# Patient Record
Sex: Male | Born: 1945 | Race: White | Hispanic: No | Marital: Married | Smoking: Never smoker
Health system: Southern US, Community
[De-identification: ages and names within clinical notes are randomized; demographics above are authoritative.]

## PROBLEM LIST (undated history)

## (undated) DIAGNOSIS — S0990XA Unspecified injury of head, initial encounter: Secondary | ICD-10-CM

## (undated) DIAGNOSIS — K219 Gastro-esophageal reflux disease without esophagitis: Secondary | ICD-10-CM

## (undated) DIAGNOSIS — E785 Hyperlipidemia, unspecified: Secondary | ICD-10-CM

## (undated) DIAGNOSIS — R9089 Other abnormal findings on diagnostic imaging of central nervous system: Secondary | ICD-10-CM

## (undated) HISTORY — PX: OTHER SURGICAL HISTORY: SHX169

## (undated) HISTORY — DX: Gastro-esophageal reflux disease without esophagitis: K21.9

## (undated) HISTORY — DX: Hyperlipidemia, unspecified: E78.5

## (undated) HISTORY — DX: Other abnormal findings on diagnostic imaging of central nervous system: R90.89

## (undated) HISTORY — DX: Unspecified injury of head, initial encounter: S09.90XA

---

## 2001-04-06 ENCOUNTER — Encounter: Payer: Self-pay | Admitting: *Deleted

## 2001-04-06 ENCOUNTER — Encounter: Payer: Self-pay | Admitting: General Surgery

## 2001-04-06 ENCOUNTER — Inpatient Hospital Stay (HOSPITAL_COMMUNITY): Admission: AC | Admit: 2001-04-06 | Discharge: 2001-04-15 | Payer: Self-pay

## 2001-04-07 ENCOUNTER — Encounter: Payer: Self-pay | Admitting: General Surgery

## 2001-04-08 ENCOUNTER — Encounter: Payer: Self-pay | Admitting: Neurosurgery

## 2001-04-08 ENCOUNTER — Encounter: Payer: Self-pay | Admitting: General Surgery

## 2001-04-09 ENCOUNTER — Encounter: Payer: Self-pay | Admitting: Neurosurgery

## 2001-04-10 ENCOUNTER — Encounter: Payer: Self-pay | Admitting: General Surgery

## 2001-04-12 ENCOUNTER — Encounter: Payer: Self-pay | Admitting: Neurosurgery

## 2001-04-12 ENCOUNTER — Encounter: Payer: Self-pay | Admitting: General Surgery

## 2001-04-29 ENCOUNTER — Encounter: Payer: Self-pay | Admitting: General Surgery

## 2001-04-29 ENCOUNTER — Ambulatory Visit (HOSPITAL_COMMUNITY): Admission: RE | Admit: 2001-04-29 | Discharge: 2001-04-29 | Payer: Self-pay | Admitting: General Surgery

## 2005-03-08 ENCOUNTER — Emergency Department (HOSPITAL_COMMUNITY): Admission: EM | Admit: 2005-03-08 | Discharge: 2005-03-08 | Payer: Self-pay | Admitting: Emergency Medicine

## 2005-08-11 ENCOUNTER — Ambulatory Visit: Payer: Self-pay | Admitting: Internal Medicine

## 2005-08-12 ENCOUNTER — Ambulatory Visit: Payer: Self-pay | Admitting: Cardiology

## 2005-08-24 ENCOUNTER — Ambulatory Visit: Payer: Self-pay | Admitting: Internal Medicine

## 2011-01-14 ENCOUNTER — Encounter: Payer: Self-pay | Admitting: Internal Medicine

## 2011-02-12 ENCOUNTER — Ambulatory Visit: Payer: BC Managed Care – PPO | Admitting: *Deleted

## 2011-02-12 VITALS — Ht 72.0 in | Wt 169.1 lb

## 2011-02-12 DIAGNOSIS — Z1211 Encounter for screening for malignant neoplasm of colon: Secondary | ICD-10-CM

## 2011-02-12 MED ORDER — NA SULFATE-K SULFATE-MG SULF 17.5-3.13-1.6 GM/177ML PO SOLN: 1.0000 | Freq: Once | ORAL | Status: DC

## 2011-02-26 ENCOUNTER — Other Ambulatory Visit: Payer: Self-pay | Admitting: Internal Medicine

## 2013-04-12 ENCOUNTER — Ambulatory Visit: Payer: Self-pay | Admitting: Physical Medicine and Rehabilitation

## 2014-05-14 ENCOUNTER — Inpatient Hospital Stay (HOSPITAL_COMMUNITY)
Admission: EM | Admit: 2014-05-14 | Discharge: 2014-05-18 | DRG: 097 | Disposition: A | Discharge status: Home / Self Care | Payer: Medicare Other | Attending: Neurology | Admitting: Neurology

## 2014-05-14 ENCOUNTER — Emergency Department (HOSPITAL_COMMUNITY): Payer: Medicare Other

## 2014-05-14 ENCOUNTER — Encounter (HOSPITAL_COMMUNITY): Payer: Self-pay | Admitting: *Deleted

## 2014-05-14 DIAGNOSIS — G9389 Other specified disorders of brain: Secondary | ICD-10-CM | POA: Diagnosis present

## 2014-05-14 DIAGNOSIS — G936 Cerebral edema: Secondary | ICD-10-CM

## 2014-05-14 DIAGNOSIS — K219 Gastro-esophageal reflux disease without esophagitis: Secondary | ICD-10-CM | POA: Diagnosis present

## 2014-05-14 DIAGNOSIS — R253 Fasciculation: Secondary | ICD-10-CM | POA: Diagnosis present

## 2014-05-14 DIAGNOSIS — R9401 Abnormal electroencephalogram [EEG]: Secondary | ICD-10-CM | POA: Diagnosis present

## 2014-05-14 DIAGNOSIS — Z886 Allergy status to analgesic agent status: Secondary | ICD-10-CM

## 2014-05-14 DIAGNOSIS — R51 Headache: Secondary | ICD-10-CM | POA: Diagnosis present

## 2014-05-14 DIAGNOSIS — Z882 Allergy status to sulfonamides status: Secondary | ICD-10-CM

## 2014-05-14 DIAGNOSIS — B004 Herpesviral encephalitis: Secondary | ICD-10-CM | POA: Diagnosis present

## 2014-05-14 DIAGNOSIS — E785 Hyperlipidemia, unspecified: Secondary | ICD-10-CM | POA: Diagnosis present

## 2014-05-14 DIAGNOSIS — G0490 Encephalitis and encephalomyelitis, unspecified: Secondary | ICD-10-CM

## 2014-05-14 DIAGNOSIS — R569 Unspecified convulsions: Secondary | ICD-10-CM

## 2014-05-14 LAB — COMPREHENSIVE METABOLIC PANEL
ALT: 14 U/L (ref 0–53)
AST: 17 U/L (ref 0–37)
Albumin: 4 g/dL (ref 3.5–5.2)
Alkaline Phosphatase: 65 U/L (ref 39–117)
Anion gap: 13 (ref 5–15)
BUN: 14 mg/dL (ref 6–23)
CO2: 26 mEq/L (ref 19–32)
Calcium: 9.3 mg/dL (ref 8.4–10.5)
Chloride: 101 mEq/L (ref 96–112)
Creatinine, Ser: 1.09 mg/dL (ref 0.50–1.35)
GFR calc Af Amer: 77 mL/min — ABNORMAL LOW (ref >90)
GFR calc non Af Amer: 67 mL/min — ABNORMAL LOW (ref >90)
Glucose, Bld: 128 mg/dL — ABNORMAL HIGH (ref 70–99)
Potassium: 4.2 mEq/L (ref 3.7–5.3)
Sodium: 140 mEq/L (ref 137–147)
Total Bilirubin: 0.3 mg/dL (ref 0.3–1.2)
Total Protein: 7.6 g/dL (ref 6.0–8.3)

## 2014-05-14 LAB — CBC
HCT: 40.6 % (ref 39.0–52.0)
Hemoglobin: 13.6 g/dL (ref 13.0–17.0)
MCH: 30 pg (ref 26.0–34.0)
MCHC: 33.5 g/dL (ref 30.0–36.0)
MCV: 89.4 fL (ref 78.0–100.0)
Platelets: 234 10*3/uL (ref 150–400)
RBC: 4.54 MIL/uL (ref 4.22–5.81)
RDW: 14 % (ref 11.5–15.5)
WBC: 6.8 10*3/uL (ref 4.0–10.5)

## 2014-05-14 LAB — I-STAT CHEM 8, ED
BUN: 15 mg/dL (ref 6–23)
Calcium, Ion: 1.15 mmol/L (ref 1.13–1.30)
Chloride: 103 mEq/L (ref 96–112)
Creatinine, Ser: 1.1 mg/dL (ref 0.50–1.35)
Glucose, Bld: 131 mg/dL — ABNORMAL HIGH (ref 70–99)
HCT: 43 % (ref 39.0–52.0)
Hemoglobin: 14.6 g/dL (ref 13.0–17.0)
Potassium: 3.9 mEq/L (ref 3.7–5.3)
Sodium: 139 mEq/L (ref 137–147)
TCO2: 25 mmol/L (ref 0–100)

## 2014-05-14 LAB — I-STAT TROPONIN, ED: Troponin i, poc: 0 ng/mL (ref 0.00–0.08)

## 2014-05-14 LAB — PROTIME-INR
INR: 1.02 (ref 0.00–1.49)
Prothrombin Time: 13.5 seconds (ref 11.6–15.2)

## 2014-05-14 LAB — DIFFERENTIAL
Basophils Absolute: 0.1 10*3/uL (ref 0.0–0.1)
Basophils Relative: 1 % (ref 0–1)
Eosinophils Absolute: 0.2 10*3/uL (ref 0.0–0.7)
Eosinophils Relative: 3 % (ref 0–5)
Lymphocytes Relative: 25 % (ref 12–46)
Lymphs Abs: 1.7 10*3/uL (ref 0.7–4.0)
Monocytes Absolute: 1 10*3/uL (ref 0.1–1.0)
Monocytes Relative: 15 % — ABNORMAL HIGH (ref 3–12)
Neutro Abs: 3.8 10*3/uL (ref 1.7–7.7)
Neutrophils Relative %: 56 % (ref 43–77)

## 2014-05-14 LAB — ETHANOL: Alcohol, Ethyl (B): <11 mg/dL (ref 0–11)

## 2014-05-14 LAB — APTT: aPTT: 39 seconds — ABNORMAL HIGH (ref 24–37)

## 2014-05-14 MED ORDER — DEXAMETHASONE SODIUM PHOSPHATE 4 MG/ML IJ SOLN
4.0000 mg | INTRAMUSCULAR | Status: DC
  Administered 2014-05-15 – 2014-05-18 (×20): 4 mg via INTRAVENOUS
  Filled 2014-05-14 (×21): qty 1

## 2014-05-14 MED ORDER — KETOROLAC TROMETHAMINE 15 MG/ML IJ SOLN
15.0000 mg | Freq: Four times a day (QID) | INTRAMUSCULAR | Status: DC
  Administered 2014-05-15 – 2014-05-18 (×5): 15 mg via INTRAVENOUS
  Filled 2014-05-14 (×8): qty 1

## 2014-05-14 MED ORDER — LEVETIRACETAM IN NACL 500 MG/100ML IV SOLN
500.0000 mg | Freq: Two times a day (BID) | INTRAVENOUS | Status: DC
  Administered 2014-05-15 – 2014-05-18 (×7): 500 mg via INTRAVENOUS
  Filled 2014-05-14 (×8): qty 100

## 2014-05-14 MED ORDER — PANTOPRAZOLE SODIUM 20 MG PO TBEC
20.0000 mg | DELAYED_RELEASE_TABLET | Freq: Every day | ORAL | Status: DC
  Administered 2014-05-15 – 2014-05-18 (×4): 20 mg via ORAL
  Filled 2014-05-14 (×4): qty 1

## 2014-05-14 MED ORDER — NICARDIPINE HCL IN NACL 20-0.86 MG/200ML-% IV SOLN
3.0000 mg/h | INTRAVENOUS | Status: DC
  Filled 2014-05-14: qty 200

## 2014-05-14 MED ORDER — DEXAMETHASONE SODIUM PHOSPHATE 10 MG/ML IJ SOLN
10.0000 mg | INTRAMUSCULAR | Status: AC
  Administered 2014-05-14: 10 mg via INTRAVENOUS
  Filled 2014-05-14: qty 1

## 2014-05-14 MED ORDER — ACETAMINOPHEN 325 MG PO TABS
650.0000 mg | ORAL_TABLET | Freq: Once | ORAL | Status: AC
  Administered 2014-05-14: 650 mg via ORAL
  Filled 2014-05-14: qty 2

## 2014-05-14 MED ORDER — HEPARIN SODIUM (PORCINE) 5000 UNIT/ML IJ SOLN
5000.0000 [IU] | Freq: Three times a day (TID) | INTRAMUSCULAR | Status: DC
  Administered 2014-05-14 – 2014-05-16 (×5): 5000 [IU] via SUBCUTANEOUS
  Filled 2014-05-14 (×5): qty 1

## 2014-05-14 MED ORDER — ONDANSETRON HCL 4 MG/2ML IJ SOLN
4.0000 mg | Freq: Once | INTRAMUSCULAR | Status: AC
  Administered 2014-05-14: 4 mg via INTRAVENOUS
  Filled 2014-05-14: qty 2

## 2014-05-14 MED ORDER — LEVETIRACETAM IN NACL 1000 MG/100ML IV SOLN
1000.0000 mg | Freq: Once | INTRAVENOUS | Status: AC
  Administered 2014-05-14: 1000 mg via INTRAVENOUS
  Filled 2014-05-14: qty 100

## 2014-05-14 MED ORDER — LORAZEPAM 2 MG/ML IJ SOLN
1.0000 mg | Freq: Once | INTRAMUSCULAR | Status: AC
  Administered 2014-05-14: 1 mg via INTRAVENOUS
  Filled 2014-05-14: qty 1

## 2014-05-14 MED ORDER — METOCLOPRAMIDE HCL 5 MG/ML IJ SOLN
10.0000 mg | Freq: Three times a day (TID) | INTRAMUSCULAR | Status: DC
  Administered 2014-05-14 – 2014-05-18 (×11): 10 mg via INTRAVENOUS
  Filled 2014-05-14 (×11): qty 2

## 2014-05-14 MED ORDER — ROSUVASTATIN CALCIUM 5 MG PO TABS
5.0000 mg | ORAL_TABLET | Freq: Every day | ORAL | Status: DC
  Administered 2014-05-15 – 2014-05-18 (×4): 5 mg via ORAL
  Filled 2014-05-14 (×4): qty 1

## 2014-05-14 MED ORDER — SODIUM CHLORIDE 0.9 % IV SOLN
75.0000 mL/h | INTRAVENOUS | Status: DC
  Administered 2014-05-14 – 2014-05-16 (×2): 75 mL/h via INTRAVENOUS
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
## 2014-05-14 NOTE — ED Notes (Signed)
Contacted [facility] to call Code Stroke °
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
## 2014-05-14 NOTE — ED Notes (Signed)
Pt stated vomiting

## 2014-05-15 ENCOUNTER — Inpatient Hospital Stay (HOSPITAL_COMMUNITY): Payer: Medicare Other

## 2014-05-15 ENCOUNTER — Encounter (HOSPITAL_COMMUNITY): Payer: Self-pay | Admitting: *Deleted

## 2014-05-15 DIAGNOSIS — R569 Unspecified convulsions: Secondary | ICD-10-CM

## 2014-05-15 LAB — URINALYSIS, ROUTINE W REFLEX MICROSCOPIC
Bilirubin Urine: NEGATIVE
Glucose, UA: NEGATIVE mg/dL
Hgb urine dipstick: NEGATIVE
Ketones, ur: NEGATIVE mg/dL
Leukocytes, UA: NEGATIVE
Nitrite: NEGATIVE
Protein, ur: NEGATIVE mg/dL
Specific Gravity, Urine: 1.02 (ref 1.005–1.030)
Urobilinogen, UA: 0.2 mg/dL (ref 0.0–1.0)
pH: 6 (ref 5.0–8.0)

## 2014-05-15 LAB — RAPID URINE DRUG SCREEN, HOSP PERFORMED
Amphetamines: NOT DETECTED
Barbiturates: NOT DETECTED
Benzodiazepines: NOT DETECTED
Cocaine: NOT DETECTED
Opiates: NOT DETECTED
Tetrahydrocannabinol: NOT DETECTED

## 2014-05-15 LAB — MRSA PCR SCREENING: MRSA by PCR: NEGATIVE

## 2014-05-15 MED ORDER — GADOBENATE DIMEGLUMINE 529 MG/ML IV SOLN
15.0000 mL | Freq: Once | INTRAVENOUS | Status: AC | PRN
  Administered 2014-05-15: 15 mL via INTRAVENOUS

## 2014-05-15 MED ORDER — DEXTROSE 5 % IV SOLN
10.0000 mg/kg | Freq: Three times a day (TID) | INTRAVENOUS | Status: DC
  Administered 2014-05-15 – 2014-05-18 (×9): 765 mg via INTRAVENOUS
  Filled 2014-05-15 (×13): qty 15.3

## 2014-05-15 NOTE — Procedures (Signed)
ELECTROENCEPHALOGRAM REPORT   Patient: Steven Taylor       Room #: 78M-02 EEG No. ID: [id number] Age: 68 y.o.        Sex: male Referring Physician: Dr Leroy Kennedyamilo Report Date:  05/15/2014        Interpreting Physician: Elspeth ChoSUMNER, Robi Mitter, Jill AlexandersJUSTIN  History: Steven ScarletDonald P Swanton is an 68 y.o. male  with new onset HA and very likely left face focal motor seizure in the context of significant vasogenic edema involving left temporal and parietal lobes with some mass effect over the ventricular system.  Medications:  Scheduled: . dexamethasone  4 mg Intravenous 6 times per day  . heparin  5,000 Units Subcutaneous 3 times per day  . ketorolac  15 mg Intravenous 4 times per day  . levETIRAcetam  500 mg Intravenous Q12H  . metoCLOPramide (REGLAN) injection  10 mg Intravenous 3 times per day  . pantoprazole  20 mg Oral Daily  . rosuvastatin  5 mg Oral Daily    Conditions of Recording:  This is a 16 channel EEG carried out with the patient in the drowsy and asleep state.  Description:  The waking background activity consists of a low voltage, symmetrical, fairly well organized, 9 to 10 Hz alpha activity that is intermixed with theta activity in the drowsy state, seen from the parieto-occipital and posterior temporal regions.  Low voltage fast activity, poorly organized, is seen anteriorly and is at times superimposed on more posterior regions.  There is noted intermittent focal slowing in the left temporal region with occasional sharp wave activity localized at T5.   The patient drowses with slowing to irregular, low voltage theta and beta activity. The patient goes in to a light sleep with symmetrical sleep spindles, vertex central sharp transients and irregular slow activity.   Hyperventilation and photic stimulation were not performed.   IMPRESSION: Abnormal EEG due to focal slowing in the left temporal region with intermittent sharp wave activity noted at T5. This may represent a potential seizure foci.     Elspeth Choeter Illa Enlow, DO [facility] [phone]  If 7pm- 7am, please page neurology on call as listed in AMION. 05/15/2014, 1:34 PM
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
## 2014-05-15 NOTE — Progress Notes (Signed)
UR completed.  Gabrielle Wakeland, RN BSN MHA CCM Trauma/Neuro ICU Case Manager [phone]  

## 2014-05-15 NOTE — Progress Notes (Signed)
ANTIBIOTIC CONSULT NOTE - INITIAL  Pharmacy Consult for Acyclovir Indication: R/O HSV encephalitits  Allergies  Allergen Reactions  . Sulfa Antibiotics Rash  . Codeine Nausea Only    Patient Measurements: Height: 6' (182.9 cm) Weight: 168 lb 10.4 oz (76.5 kg) IBW/kg (Calculated) : 77.6   Vital Signs: Temp: 97.8 F (36.6 C) (12/15 1600) Temp Source: Oral (12/15 1600) BP: 111/56 mmHg (12/15 1400) Pulse Rate: 71 (12/15 1400) Intake/Output from previous day: 12/14 0701 - 12/15 0700 In: 595 [I.V.:595] Out: 650 [Urine:350; Emesis/NG output:300] Intake/Output from this shift: Total I/O In: 475 [I.V.:375; IV Piggyback:100] Out: 200 [Urine:200]  Labs:  Recent Labs  05/14/14 2202 05/14/14 2210  WBC 6.8  --   HGB 13.6 14.6  PLT 234  --   CREATININE 1.09 1.10   Estimated Creatinine Clearance: 67.6 mL/min (by C-G formula based on Cr of 1.1). No results for input(s): VANCOTROUGH, VANCOPEAK, VANCORANDOM, GENTTROUGH, GENTPEAK, GENTRANDOM, TOBRATROUGH, TOBRAPEAK, TOBRARND, AMIKACINPEAK, AMIKACINTROU, AMIKACIN in the last 72 hours.   Microbiology: Recent Results (from the past 720 hour(s))  MRSA PCR Screening     Status: None   Collection Time: 05/15/14 12:26 AM  Result Value Ref Range Status   MRSA by PCR NEGATIVE NEGATIVE Final    Comment:        The GeneXpert MRSA Assay (FDA approved for NASAL specimens only), is one component of a comprehensive MRSA colonization surveillance program. It is not intended to diagnose MRSA infection nor to guide or monitor treatment for MRSA infections.     Medical History: Past Medical History  Diagnosis Date  . GERD (gastroesophageal reflux disease)   . Hyperlipidemia     Assessment: 68yom with acute onset MS changes and facial twitching possible new Sz activity.  WBC wnl, renal fx stable.      Plan:  Acyclovir 10mg /kg = 770mg  q8 hr  Leota SauersLisa Beckham Capistran Pharm.D. CPP, BCPS Clinical Pharmacist [phone] 05/15/2014 4:40  PM

## 2014-05-15 NOTE — Consult Note (Signed)
Reason for Consult:Brain edema Referring Physician: Neurology RANNY WIEBELHAUS is an 68 y.o. male.  HPI: 68 year old male in usual state of health until last evening when he had episode of confusion. He was having trouble driving, and was actually stopped and evaluated by the police twice. He was brought to [facility] ED where he underwent CT head followed by MRI that showed unusual edema pattern in the left hemisphere with some mild swelling of the temporal lobe, but no shift. There vwas no mass noted, and radiology was not sure what was going on but was suspicious for some sort of encephalitis. Neurosurgery was consulted for consideration of a brain biopsy.   Past Medical History  Diagnosis Date  . GERD (gastroesophageal reflux disease)   . Hyperlipidemia     Past Surgical History  Procedure Laterality Date  . Parosmia    . Disc surgurgy      1991    History reviewed. No pertinent family history.  Social History:  reports that he has never smoked. He has never used smokeless tobacco. He reports that he does not drink alcohol or use illicit drugs.  Allergies:  Allergies  Allergen Reactions  . Sulfa Antibiotics Rash  . Codeine Nausea Only    Medications: I have reviewed the patient's current medications.  Results for orders placed or performed during the hospital encounter of 05/14/14 (from the past 48 hour(s))  Ethanol     Status: None   Collection Time: 05/14/14 10:02 PM  Result Value Ref Range   Alcohol, Ethyl (B) <11 0 - 11 mg/dL    Comment:        LOWEST DETECTABLE LIMIT FOR SERUM ALCOHOL IS 11 mg/dL FOR MEDICAL PURPOSES ONLY   Protime-INR     Status: None   Collection Time: 05/14/14 10:02 PM  Result Value Ref Range   Prothrombin Time 13.5 11.6 - 15.2 seconds   INR 1.02 0.00 - 1.49  APTT     Status: Abnormal   Collection Time: 05/14/14 10:02 PM  Result Value Ref Range   aPTT 39 (H) 24 - 37 seconds    Comment:        IF BASELINE aPTT IS ELEVATED, SUGGEST PATIENT  RISK ASSESSMENT BE USED TO DETERMINE APPROPRIATE ANTICOAGULANT THERAPY.   CBC     Status: None   Collection Time: 05/14/14 10:02 PM  Result Value Ref Range   WBC 6.8 4.0 - 10.5 K/uL   RBC 4.54 4.22 - 5.81 MIL/uL   Hemoglobin 13.6 13.0 - 17.0 g/dL   HCT 40.6 39.0 - 52.0 %   MCV 89.4 78.0 - 100.0 fL   MCH 30.0 26.0 - 34.0 pg   MCHC 33.5 30.0 - 36.0 g/dL   RDW 14.0 11.5 - 15.5 %   Platelets 234 150 - 400 K/uL  Differential     Status: Abnormal   Collection Time: 05/14/14 10:02 PM  Result Value Ref Range   Neutrophils Relative % 56 43 - 77 %   Neutro Abs 3.8 1.7 - 7.7 K/uL   Lymphocytes Relative 25 12 - 46 %   Lymphs Abs 1.7 0.7 - 4.0 K/uL   Monocytes Relative 15 (H) 3 - 12 %   Monocytes Absolute 1.0 0.1 - 1.0 K/uL   Eosinophils Relative 3 0 - 5 %   Eosinophils Absolute 0.2 0.0 - 0.7 K/uL   Basophils Relative 1 0 - 1 %   Basophils Absolute 0.1 0.0 - 0.1 K/uL  Comprehensive metabolic panel  Status: Abnormal   Collection Time: 05/14/14 10:02 PM  Result Value Ref Range   Sodium 140 137 - 147 mEq/L   Potassium 4.2 3.7 - 5.3 mEq/L   Chloride 101 96 - 112 mEq/L   CO2 26 19 - 32 mEq/L   Glucose, Bld 128 (H) 70 - 99 mg/dL   BUN 14 6 - 23 mg/dL   Creatinine, Ser 1.09 0.50 - 1.35 mg/dL   Calcium 9.3 8.4 - 10.5 mg/dL   Total Protein 7.6 6.0 - 8.3 g/dL   Albumin 4.0 3.5 - 5.2 g/dL   AST 17 0 - 37 U/L   ALT 14 0 - 53 U/L   Alkaline Phosphatase 65 39 - 117 U/L   Total Bilirubin 0.3 0.3 - 1.2 mg/dL   GFR calc non Af Amer 67 (L) >90 mL/min   GFR calc Af Amer 77 (L) >90 mL/min    Comment: (NOTE) The eGFR has been calculated using the CKD EPI equation. This calculation has not been validated in all clinical situations. eGFR's persistently <90 mL/min signify possible Chronic Kidney Disease.    Anion gap 13 5 - 15  I-Stat Troponin, ED (not at [facility])     Status: None   Collection Time: 05/14/14 10:08 PM  Result Value Ref Range   Troponin i, poc 0.00 0.00 - 0.08 ng/mL   Comment 3             Comment: Due to the release kinetics of cTnI, a negative result within the first hours of the onset of symptoms does not rule out myocardial infarction with certainty. If myocardial infarction is still suspected, repeat the test at appropriate intervals.   I-Stat Chem 8, ED     Status: Abnormal   Collection Time: 05/14/14 10:10 PM  Result Value Ref Range   Sodium 139 137 - 147 mEq/L   Potassium 3.9 3.7 - 5.3 mEq/L   Chloride 103 96 - 112 mEq/L   BUN 15 6 - 23 mg/dL   Creatinine, Ser 1.10 0.50 - 1.35 mg/dL   Glucose, Bld 131 (H) 70 - 99 mg/dL   Calcium, Ion 1.15 1.13 - 1.30 mmol/L   TCO2 25 0 - 100 mmol/L   Hemoglobin 14.6 13.0 - 17.0 g/dL   HCT 43.0 39.0 - 52.0 %  MRSA PCR Screening     Status: None   Collection Time: 05/15/14 12:26 AM  Result Value Ref Range   MRSA by PCR NEGATIVE NEGATIVE    Comment:        The GeneXpert MRSA Assay (FDA approved for NASAL specimens only), is one component of a comprehensive MRSA colonization surveillance program. It is not intended to diagnose MRSA infection nor to guide or monitor treatment for MRSA infections.   Urine Drug Screen     Status: None   Collection Time: 05/15/14  1:18 AM  Result Value Ref Range   Opiates NONE DETECTED NONE DETECTED   Cocaine NONE DETECTED NONE DETECTED   Benzodiazepines NONE DETECTED NONE DETECTED   Amphetamines NONE DETECTED NONE DETECTED   Tetrahydrocannabinol NONE DETECTED NONE DETECTED   Barbiturates NONE DETECTED NONE DETECTED    Comment:        DRUG SCREEN FOR MEDICAL PURPOSES ONLY.  IF CONFIRMATION IS NEEDED FOR ANY PURPOSE, NOTIFY LAB WITHIN 5 DAYS.        LOWEST DETECTABLE LIMITS FOR URINE DRUG SCREEN Drug Class       Cutoff (ng/mL) Amphetamine      1000 Barbiturate  200 Benzodiazepine   149 Tricyclics       702 Opiates          300 Cocaine          300 THC              50   Urinalysis, Routine w reflex microscopic     Status: None   Collection Time: 05/15/14  1:18 AM   Result Value Ref Range   Color, Urine YELLOW YELLOW   APPearance CLEAR CLEAR   Specific Gravity, Urine 1.020 1.005 - 1.030   pH 6.0 5.0 - 8.0   Glucose, UA NEGATIVE NEGATIVE mg/dL   Hgb urine dipstick NEGATIVE NEGATIVE   Bilirubin Urine NEGATIVE NEGATIVE   Ketones, ur NEGATIVE NEGATIVE mg/dL   Protein, ur NEGATIVE NEGATIVE mg/dL   Urobilinogen, UA 0.2 0.0 - 1.0 mg/dL   Nitrite NEGATIVE NEGATIVE   Leukocytes, UA NEGATIVE NEGATIVE    Comment: MICROSCOPIC NOT DONE ON URINES WITH NEGATIVE PROTEIN, BLOOD, LEUKOCYTES, NITRITE, OR GLUCOSE <1000 mg/dL.    Ct Head Wo Contrast  05/14/2014   CLINICAL DATA:  Code stroke.  Left facial droop.  Frontal headache.  EXAM: CT HEAD WITHOUT CONTRAST  TECHNIQUE: Contiguous axial images were obtained from the base of the skull through the vertex without intravenous contrast.  COMPARISON:  04/12/2001 report  FINDINGS: Abnormal primarily white matter edema in the left temporal and parietal lobes, effacing the occipital horn and temporal horn of left lateral ventricle. Remote left frontal encephalomalacia, image 16 series 201. No intracranial hemorrhage is observed. I do not see an obvious mass although the vasogenic edema could be secondary to an underlying occult mass.  Basilar cisterns patent. No intracranial hemorrhage. Prior terminate resection and ethmoidectomy.  IMPRESSION: 1. Extensive white matter edema in the left temporal and parietal lobes, concerning for vasogenic edema raising the possibility of underlying mass or encephalitis. This would be an unusual pattern for acute CVA. When feasible, MRI should be considered for further workup. The edema is causing some effacement of the left ventricular system. No hydrocephalus. 2. 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 Critical Value/emergent results were called by telephone at the time of interpretation  on 05/14/2014 at 10:22 pm to Dr. Aram Beecham, who verbally acknowledged these results.   Electronically Signed   By: Sherryl Barters M.D.   On: 05/14/2014 22:25   Mr Jeri Cos OV Contrast  05/15/2014   CLINICAL DATA:  Initial evaluation for confusion, right-sided facial droop, slurred speech.  EXAM: MRI HEAD WITHOUT AND WITH CONTRAST  TECHNIQUE: Multiplanar, multiecho pulse sequences of the brain and surrounding structures were obtained without and with intravenous contrast.  CONTRAST:  35mL MULTIHANCE GADOBENATE DIMEGLUMINE 529 MG/ML IV SOLN  COMPARISON:  Prior CT performed on 05/14/2014.  FINDINGS: Mild diffuse prominence of the CSF containing spaces is compatible with generalized age-related cerebral atrophy. Mild patchy T2/FLAIR hyperintensity within the periventricular and deep white matter noted, nonspecific, but likely related to chronic small vessel ischemic changes.  Abnormal T2/FLAIR signal intensity seen involving the left temporal lobe, most compatible with cerebral edema. Changes predominantly involve the subcortical white matter, although there is some gyral edema and cortical involvement within this region. Abnormal FLAIR signal seen involving the splenium. There is additional abnormal T2/FLAIR signal intensity within the peripheral right temporal lobe as well, although not as pronounced as the left side. There is associated irregular  patchy areas of enhancement measuring 1.7 x 2.2 x 1.1 cm within the right temporal lobe (series 11, image 7). An additional smaller satellite nodule of enhancement slightly more posteriorly within the right temporal lobe measures 1.1 x 0.9 x 0.8 cm. No abnormal enhancement seen within the area of edema on the left temporal lobe. Additional smaller patchy areas of enhancement seen within the bilateral frontal lobes. No basal ganglia involvement. These findings are of uncertain etiology.  No midline shift. No hydrocephalus. There is no extra-axial fluid collection.  No  abnormal foci of restricted diffusion to suggest acute intracranial infarct. Normal intravascular flow voids seen within the brain.  Craniocervical junction is normal.  Basilar cisterns remain patent.  No acute abnormality seen about the orbits. Pituitary gland within normal limits.  Paranasal sinuses and mastoid air cells are clear.  IMPRESSION: 1. Multi focal areas of abnormal T2/FLAIR signal intensity with edema involving the bilateral temporal lobes, left greater than right, with additional involvement of the splenium and bilateral frontal lobes. There are areas of associated irregular postcontrast enhancement within the right temporal lobe. Findings are of uncertain etiology, with primary differential consideration including an acute encephalitis. Possible ADEM could also be considered. Involvement of the splenium raises the possibility of underlying diffusely infiltrative malignancy (gliomatosis cerebri). Lymphoma is felt to be unlikely given the lack of restricted diffusion.   Electronically Signed   By: Jeannine Boga M.D.   On: 05/15/2014 04:23    Review of systems not obtained due to patient factors. Blood pressure 131/59, pulse 72, temperature 97.8 F (36.6 C), temperature source Oral, resp. rate 18, height 6' (1.829 m), weight 77.792 kg (171 lb 8 oz), SpO2 98 %. Patient is awake, alert, conversant. he follows complex commands well. He is completely aware of what happened to him last night.  Assessment/Plan: CT and MRI is reviewed. 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 These patients however are usually quite ill, and Mr Wolz loooks quite good at this time. I have shown these films to many of my associates ( [place], [place], Vertell Limber, [place])  And we feel that there is a great deal that could be gained from an LP, and it might allow this gentleman to avoid having brain surgery. In addition, there is a great chance of no  diagnosis on biopsy secondary to sampling issues. There is apparently some concern about an LP due to his brain swelling, but none of the neurosurgeons felt that that was a significan risk, as his scans may show some edema, buit do not appear to show true increased ICP. We will be happy to follow, but do not feel that a brain biopsy should be the initial test in this gentleman in an attempt to make a diagnosis.  Faythe Ghee, MD 05/15/2014, 10:47 PM

## 2014-05-15 NOTE — Progress Notes (Signed)
EEG completed, results pending. 

## 2014-05-16 NOTE — Progress Notes (Signed)
Subjective: Patient remains stable.  No further seizures noted.  No new complaints.    Objective: Current vital signs: BP 120/50 mmHg  Pulse 63  Temp(Src) 98 F (36.7 C) (Oral)  Resp 16  Ht 6' (1.829 m)  Wt 77.792 kg (171 lb 8 oz)  BMI 23.25 kg/m2  SpO2 95% Vital signs in last 24 hours: Temp:  [97.6 F (36.4 C)-98 F (36.7 C)] 98 F (36.7 C) (12/16 0503) Pulse Rate:  [63-79] 63 (12/16 0503) Resp:  [12-18] 16 (12/16 0503) BP: (108-144)/(49-95) 120/50 mmHg (12/16 0503) SpO2:  [93 %-98 %] 95 % (12/16 0503) Weight:  [77.792 kg (171 lb 8 oz)] 77.792 kg (171 lb 8 oz) (12/15 2155)  Intake/Output from previous day: 12/15 0701 - 12/16 0700 In: 850 [I.V.:750; IV Piggyback:100] Out: 600 [Urine:600] Intake/Output this shift:   Nutritional status: Diet regular  Neurologic Exam: Mental Status: Alert, oriented x 4, thought content appropriate. Speech mildly dysarthric without evidence of aphasia. Able to follow 3 step commands without difficulty. Cranial Nerves: II: Discs flat bilaterally; Visual fields grossly normal, pupils equal, round, reactive to light and accommodation III,IV, VI: ptosis not present, extra-ocular motions intact bilaterally V,VII: smile asymmetric due to mild left facial droop, facial light touch sensation normal bilaterally VIII: hearing normal bilaterally IX,X: gag reflex present XI: bilateral shoulder shrug XII: midline tongue extension without atrophy or fasciculations  Motor: 5/5 throughout; no atrophy noted Sensory: Pinprick and light touch intact throughout, bilaterally Deep Tendon Reflexes:  2+ throughout Plantars: Right: downgoingLeft: downgoing Cerebellar: normal finger-to-nose, normal heel-to-shin testing bilaterally  Lab Results: Basic Metabolic Panel:  Recent Labs Lab 05/14/14 2202 05/14/14 2210  NA 140 139  K 4.2 3.9  CL 101 103  CO2 26  --   GLUCOSE 128* 131*  BUN 14 15  CREATININE 1.09 1.10   CALCIUM 9.3  --     Liver Function Tests:  Recent Labs Lab 05/14/14 2202  AST 17  ALT 14  ALKPHOS 65  BILITOT 0.3  PROT 7.6  ALBUMIN 4.0   No results for input(s): LIPASE, AMYLASE in the last 168 hours. No results for input(s): AMMONIA in the last 168 hours.  CBC:  Recent Labs Lab 05/14/14 2202 05/14/14 2210  WBC 6.8  --   NEUTROABS 3.8  --   HGB 13.6 14.6  HCT 40.6 43.0  MCV 89.4  --   PLT 234  --     Cardiac Enzymes: No results for input(s): CKTOTAL, CKMB, CKMBINDEX, TROPONINI in the last 168 hours.  Lipid Panel: No results for input(s): CHOL, TRIG, HDL, CHOLHDL, VLDL, LDLCALC in the last 168 hours.  CBG: No results for input(s): GLUCAP in the last 168 hours.  Microbiology: Results for orders placed or performed during the hospital encounter of 05/14/14  MRSA PCR Screening     Status: None   Collection Time: 05/15/14 12:26 AM  Result Value Ref Range Status   MRSA by PCR NEGATIVE NEGATIVE Final    Comment:        The GeneXpert MRSA Assay (FDA approved for NASAL specimens only), is one component of a comprehensive MRSA colonization surveillance program. It is not intended to diagnose MRSA infection nor to guide or monitor treatment for MRSA infections.     Coagulation Studies:  Recent Labs  05/14/14 2202  LABPROT 13.5  INR 1.02    Imaging: Ct Head Wo Contrast  05/14/2014   CLINICAL DATA:  Code stroke.  Left facial droop.  Frontal headache.  EXAM:  CT HEAD WITHOUT CONTRAST  TECHNIQUE: Contiguous axial images were obtained from the base of the skull through the vertex without intravenous contrast.  COMPARISON:  04/12/2001 report  FINDINGS: Abnormal primarily white matter edema in the left temporal and parietal lobes, effacing the occipital horn and temporal horn of left lateral ventricle. Remote left frontal encephalomalacia, image 16 series 201. No intracranial hemorrhage is observed. I do not see an obvious mass although the vasogenic edema  could be secondary to an underlying occult mass.  Basilar cisterns patent. No intracranial hemorrhage. Prior terminate resection and ethmoidectomy.  IMPRESSION: 1. Extensive white matter edema in the left temporal and parietal lobes, concerning for vasogenic edema raising the possibility of underlying mass or encephalitis. This would be an unusual pattern for acute CVA. When feasible, MRI should be considered for further workup. The edema is causing some effacement of the left ventricular system. No hydrocephalus. 2. 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 Critical Value/emergent results were called by telephone at the time of interpretation on 05/14/2014 at 10:22 pm to Dr. Cyril Mourning, who verbally acknowledged these results.   Electronically Signed   By: Herbie Baltimore M.D.   On: 05/14/2014 22:25   Mr Laqueta Jean ZO Contrast  05/15/2014   CLINICAL DATA:  Initial evaluation for confusion, right-sided facial droop, slurred speech.  EXAM: MRI HEAD WITHOUT AND WITH CONTRAST  TECHNIQUE: Multiplanar, multiecho pulse sequences of the brain and surrounding structures were obtained without and with intravenous contrast.  CONTRAST:  15mL MULTIHANCE GADOBENATE DIMEGLUMINE 529 MG/ML IV SOLN  COMPARISON:  Prior CT performed on 05/14/2014.  FINDINGS: Mild diffuse prominence of the CSF containing spaces is compatible with generalized age-related cerebral atrophy. Mild patchy T2/FLAIR hyperintensity within the periventricular and deep white matter noted, nonspecific, but likely related to chronic small vessel ischemic changes.  Abnormal T2/FLAIR signal intensity seen involving the left temporal lobe, most compatible with cerebral edema. Changes predominantly involve the subcortical white matter, although there is some gyral edema and cortical involvement within this region. Abnormal FLAIR signal seen involving the  splenium. There is additional abnormal T2/FLAIR signal intensity within the peripheral right temporal lobe as well, although not as pronounced as the left side. There is associated irregular patchy areas of enhancement measuring 1.7 x 2.2 x 1.1 cm within the right temporal lobe (series 11, image 7). An additional smaller satellite nodule of enhancement slightly more posteriorly within the right temporal lobe measures 1.1 x 0.9 x 0.8 cm. No abnormal enhancement seen within the area of edema on the left temporal lobe. Additional smaller patchy areas of enhancement seen within the bilateral frontal lobes. No basal ganglia involvement. These findings are of uncertain etiology.  No midline shift. No hydrocephalus. There is no extra-axial fluid collection.  No abnormal foci of restricted diffusion to suggest acute intracranial infarct. Normal intravascular flow voids seen within the brain.  Craniocervical junction is normal.  Basilar cisterns remain patent.  No acute abnormality seen about the orbits. Pituitary gland within normal limits.  Paranasal sinuses and mastoid air cells are clear.  IMPRESSION: 1. Multi focal areas of abnormal T2/FLAIR signal intensity with edema involving the bilateral temporal lobes, left greater than right, with additional involvement of the splenium and bilateral frontal lobes. There are areas of associated irregular postcontrast enhancement within the right temporal lobe. Findings are of uncertain etiology, with primary differential consideration including an acute encephalitis. Possible  ADEM could also be considered. Involvement of the splenium raises the possibility of underlying diffusely infiltrative malignancy (gliomatosis cerebri). Lymphoma is felt to be unlikely given the lack of restricted diffusion.   Electronically Signed   By: Rise MuBenjamin  McClintock M.D.   On: 05/15/2014 04:23    Medications:  I have reviewed the patient's current medications. Scheduled: . acyclovir  10 mg/kg  Intravenous 3 times per day  . dexamethasone  4 mg Intravenous 6 times per day  . ketorolac  15 mg Intravenous 4 times per day  . levETIRAcetam  500 mg Intravenous Q12H  . metoCLOPramide (REGLAN) injection  10 mg Intravenous 3 times per day  . pantoprazole  20 mg Oral Daily  . rosuvastatin  5 mg Oral Daily    Assessment/Plan: Patient remains stable and afebrile.  On Dexamethasone.  Although herpes encephalitis unlikely patient also started on Acyclovir.  Tolerating both well.  Neurosurgery has seen the patient and would like to postpone brain biopsy until after an LP if LP does not produce a diagnosis.  They feel LP is not contraindicated despite edema.     BP controlled.  No further seizures noted.  Recommendations: 1.  SQ heparin discontinued today with plans for LP in AM 2.  Continue Decadron and Keppra 3.  Acyclovir being dosed per pharmacy 4.  Will repeat MRI in 1-2 days 5.  SCD's   LOS: 2 days   Thana FarrLeslie Tessie Ordaz, MD [facility] [phone] 05/16/2014  9:58 AM

## 2014-05-16 NOTE — Progress Notes (Signed)
Patient ID: Steven Taylor, male   DOB: 05/22/1946, 68 y.o.   MRN: [id number] Afeb, vss Awake, alert, completely appropriate in conversation. For LP tomorrow per neurology. Will continue to follow.

## 2014-05-17 LAB — CSF CELL COUNT WITH DIFFERENTIAL
RBC Count, CSF: 0 /mm3
TUBE #: 1
WBC CSF: 0 /mm3 (ref 0–5)

## 2014-05-17 LAB — PROTEIN AND GLUCOSE, CSF
GLUCOSE CSF: 100 mg/dL — AB (ref 43–76)
Total  Protein, CSF: 39 mg/dL (ref 15–45)

## 2014-05-17 LAB — CRYPTOCOCCAL ANTIGEN, CSF: Crypto Ag: NEGATIVE

## 2014-05-17 LAB — SEDIMENTATION RATE: SED RATE: 12 mm/h (ref 0–16)

## 2014-05-17 LAB — C-REACTIVE PROTEIN

## 2014-05-17 NOTE — Procedures (Addendum)
LP Procedure Note:  Patient has been seen and examined.  Chart has been reviewed.  LP is being performed to evaluate intracranial edema.  Procedure has been explained to patient/family including risks and benefits.  Consent has been signed by patient/family and witnessed.   Blood pressure 119/51, pulse 74, temperature 97.7 F (36.5 C), temperature source Axillary, resp. rate 18, height 6' (1.829 m), weight 77.792 kg (171 lb 8 oz), SpO2 95 %.  Current facility-administered medications: 0.9 %  sodium chloride infusion, 75 mL/hr, Intravenous, Continuous, Lunette Standssvaldo A Camilo, MD, Last Rate: 75 mL/hr at 05/16/14 0819, 75 mL/hr at 05/16/14 0819;  acyclovir (ZOVIRAX) 765 mg in dextrose 5 % 150 mL IVPB, 10 mg/kg, Intravenous, 3 times per day, Lunette Standssvaldo A Camilo, MD, 765 mg at 05/17/14 0537 dexamethasone (DECADRON) injection 4 mg, 4 mg, Intravenous, 6 times per day, Lunette Standssvaldo A Camilo, MD, 4 mg at 05/17/14 0827;  ketorolac (TORADOL) 15 MG/ML injection 15 mg, 15 mg, Intravenous, 4 times per day, Lunette Standssvaldo A Camilo, MD, 15 mg at 05/15/14 1255;  levETIRAcetam (KEPPRA) IVPB 500 mg/100 mL premix, 500 mg, Intravenous, Q12H, Lunette Standssvaldo A Camilo, MD, 500 mg at 05/16/14 2108 metoCLOPramide (REGLAN) injection 10 mg, 10 mg, Intravenous, 3 times per day, Lunette Standssvaldo A Camilo, MD, 10 mg at 05/17/14 0537;  nicardipine (CARDENE) 20mg  in 0.86% saline 200ml IV infusion (0.1 mg/ml), 3-15 mg/hr, Intravenous, Continuous, Lunette Standssvaldo A Camilo, MD, 3 mg/hr at 05/14/14 2330;  pantoprazole (PROTONIX) EC tablet 20 mg, 20 mg, Oral, Daily, Lunette Standssvaldo A Camilo, MD, 20 mg at 05/16/14 0959 rosuvastatin (CRESTOR) tablet 5 mg, 5 mg, Oral, Daily, Lunette Standssvaldo A Camilo, MD, 5 mg at 05/16/14 0959   Recent Labs  05/14/14 2202 05/14/14 2210  WBC 6.8  --   HGB 13.6 14.6  HCT 40.6 43.0  PLT 234  --   INR 1.02  --     CT of head:  IMPRESSION: 1. Extensive white matter edema in the left temporal and parietal lobes, concerning for vasogenic edema raising the  possibility of underlying mass or encephalitis. This would be an unusual pattern for acute CVA. When feasible, MRI should be considered for further workup. The edema is causing some effacement of the left ventricular system. No hydrocephalus. 2. 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 Critical Value/emergent results were called by telephone at the time of interpretation on 05/14/2014 at 10:22 pm to Dr. Cyril MourningAMILLO, who verbally acknowledged these results.   Patient was placed in the lateral decub/sitting position.  Area was cleaned with betadine and anesthetized with lidocaine.  Under sterile conditions 20G LP needle was placed at approximately L3-4 without difficulty.  Opening pressure was documented at 12 cm H2O .  Approximately 5-6cc of clear CSF fluid was obtained and sent for studies.  No complications were noted.     Felicie MornDavid Macioce PA-C [facility] [phone]  05/17/2014, 1:15 PM   Clinical course and management discussed.  Necessary edits performed.  I agree with the above.    Thana FarrLeslie Sherleen Pangborn, MD [facility] [phone]  05/17/2014  1:13 PM

## 2014-05-17 NOTE — Progress Notes (Signed)
Subjective: Patient remains stable with non complaints. No further seizure.   Objective: Current vital signs: BP 119/51 mmHg  Pulse 74  Temp(Src) 97.7 F (36.5 C) (Axillary)  Resp 18  Ht 6' (1.829 m)  Wt 77.792 kg (171 lb 8 oz)  BMI 23.25 kg/m2  SpO2 95% Vital signs in last 24 hours: Temp:  [97.6 F (36.4 C)-98.4 F (36.9 C)] 97.7 F (36.5 C) (12/17 0955) Pulse Rate:  [62-74] 74 (12/17 0955) Resp:  [14-18] 18 (12/17 0955) BP: (105-127)/(43-67) 119/51 mmHg (12/17 0955) SpO2:  [94 %-98 %] 95 % (12/17 0955)  Intake/Output from previous day: 12/16 0701 - 12/17 0700 In: 120 [P.O.:120] Out: -  Intake/Output this shift: Total I/O In: 240 [P.O.:240] Out: -  Nutritional status: Diet regular  Neurologic Exam: Mental Status: Alert, oriented x 4, thought content appropriate. Speech mildly dysarthric without evidence of aphasia. Able to follow 3 step commands without difficulty. Cranial Nerves: II: Discs flat bilaterally; Visual fields grossly normal, pupils equal, round, reactive to light and accommodation III,IV, VI: ptosis not present, extra-ocular motions intact bilaterally V,VII: smile asymmetric due to mild left facial droop, facial light touch sensation normal bilaterally VIII: hearing normal bilaterally IX,X: gag reflex present XI: bilateral shoulder shrug XII: midline tongue extension without atrophy or fasciculations  Motor: 5/5 throughout; no atrophy noted Sensory: Pinprick and light touch intact throughout, bilaterally Deep Tendon Reflexes:  2+ throughout Plantars: Right: downgoingLeft: downgoing Cerebellar: normal finger-to-nose, normal heel-to-shin testing bilaterally   Lab Results: Basic Metabolic Panel:  Recent Labs Lab 05/14/14 2202 05/14/14 2210  NA 140 139  K 4.2 3.9  CL 101 103  CO2 26  --   GLUCOSE 128* 131*  BUN 14 15  CREATININE 1.09 1.10  CALCIUM 9.3  --     Liver Function Tests:  Recent  Labs Lab 05/14/14 2202  AST 17  ALT 14  ALKPHOS 65  BILITOT 0.3  PROT 7.6  ALBUMIN 4.0   No results for input(s): LIPASE, AMYLASE in the last 168 hours. No results for input(s): AMMONIA in the last 168 hours.  CBC:  Recent Labs Lab 05/14/14 2202 05/14/14 2210  WBC 6.8  --   NEUTROABS 3.8  --   HGB 13.6 14.6  HCT 40.6 43.0  MCV 89.4  --   PLT 234  --     Cardiac Enzymes: No results for input(s): CKTOTAL, CKMB, CKMBINDEX, TROPONINI in the last 168 hours.  Lipid Panel: No results for input(s): CHOL, TRIG, HDL, CHOLHDL, VLDL, LDLCALC in the last 168 hours.  CBG: No results for input(s): GLUCAP in the last 168 hours.  Microbiology: Results for orders placed or performed during the hospital encounter of 05/14/14  MRSA PCR Screening     Status: None   Collection Time: 05/15/14 12:26 AM  Result Value Ref Range Status   MRSA by PCR NEGATIVE NEGATIVE Final    Comment:        The GeneXpert MRSA Assay (FDA approved for NASAL specimens only), is one component of a comprehensive MRSA colonization surveillance program. It is not intended to diagnose MRSA infection nor to guide or monitor treatment for MRSA infections.     Coagulation Studies:  Recent Labs  05/14/14 2202  LABPROT 13.5  INR 1.02    Imaging: No results found.  Medications:  Prior to Admission:  Prescriptions prior to admission  Medication Sig Dispense Refill Last Dose  . cyanocobalamin 500 MCG tablet Take 500 mcg by mouth daily.   05/14/2014 at  Unknown time  . esomeprazole (NEXIUM) 40 MG capsule Take 40 mg by mouth daily at 12 noon.   05/13/2014 at Unknown time  . lansoprazole (PREVACID) 30 MG capsule Take 30 mg by mouth as needed.       . Na Sulfate-K Sulfate-Mg Sulf (SUPREP BOWEL PREP) SOLN Take 1 kit by mouth once. (Patient not taking: Reported on 05/14/2014) 354 mL 0   . rosuvastatin (CRESTOR) 5 MG tablet Take 5 mg by mouth as needed.        Scheduled: . acyclovir  10 mg/kg Intravenous  3 times per day  . dexamethasone  4 mg Intravenous 6 times per day  . ketorolac  15 mg Intravenous 4 times per day  . levETIRAcetam  500 mg Intravenous Q12H  . metoCLOPramide (REGLAN) injection  10 mg Intravenous 3 times per day  . pantoprazole  20 mg Oral Daily  . rosuvastatin  5 mg Oral Daily    Assessment/Plan:  Patient remains stable and afebrile. On Dexamethasone. Although herpes encephalitis unlikely patient also started on Acyclovir. Tolerating both well. Neurosurgery has seen the patient and would like to postpone brain biopsy until after an LP if LP does not produce a diagnosis. They feel LP is not contraindicated despite edema.  BP controlled. No further seizures noted.  Recommendations:  1. Continue Decadron and Keppra 2. Acyclovir being dosed per pharmacy 3. Will repeat MRI in 1-2 days 4. SCD's 5. Awaiting LP results.       Ogle Hoeffner PA-C [facility] [phone]  05/17/2014, 12:09 PM

## 2014-05-18 ENCOUNTER — Inpatient Hospital Stay (HOSPITAL_COMMUNITY): Payer: Medicare Other

## 2014-05-18 DIAGNOSIS — G936 Cerebral edema: Secondary | ICD-10-CM

## 2014-05-18 LAB — HERPES SIMPLEX VIRUS(HSV) DNA BY PCR
HSV 1 DNA: NOT DETECTED
HSV 2 DNA: NOT DETECTED

## 2014-05-18 LAB — HIV ANTIBODY (ROUTINE TESTING W REFLEX): HIV: NONREACTIVE

## 2014-05-18 MED ORDER — DEXAMETHASONE 4 MG PO TABS
4.0000 mg | ORAL_TABLET | Freq: Three times a day (TID) | ORAL | Status: DC
  Administered 2014-05-18: 4 mg via ORAL
  Filled 2014-05-18: qty 1

## 2014-05-18 MED ORDER — PANTOPRAZOLE SODIUM 20 MG PO TBEC: 20.0000 mg | DELAYED_RELEASE_TABLET | Freq: Every day | ORAL | Status: AC

## 2014-05-18 MED ORDER — LEVETIRACETAM 500 MG PO TABS
500.0000 mg | ORAL_TABLET | Freq: Two times a day (BID) | ORAL | Status: DC
  Administered 2014-05-18: 500 mg via ORAL
  Filled 2014-05-18: qty 1

## 2014-05-18 MED ORDER — DEXAMETHASONE 4 MG PO TABS: 4.0000 mg | ORAL_TABLET | Freq: Three times a day (TID) | ORAL | Status: DC

## 2014-05-18 MED ORDER — LEVETIRACETAM 500 MG PO TABS: 500.0000 mg | ORAL_TABLET | Freq: Two times a day (BID) | ORAL | Status: DC

## 2014-05-18 NOTE — Progress Notes (Signed)
Discharge orders received. Pt for discharge home today. IV d/c'd. Pt given discharge instructions and prescriptions with verbalized understanding. Family in room to assist with discharge. Staff brought pt downstairs via wheelchair.   

## 2014-05-18 NOTE — Progress Notes (Signed)
Patient ID: Steven Taylor, male   DOB: 10/31/1945, 68 y.o.   MRN: [id number] Afeb, vss He is back to his neurological baseline, and is intact as far as I can tell. His LP results are pending. His MRI scan today looks the same as the scan from Tuesday. At this time, he looks good, and I do not see why he could not be discharged and followed as an out patient for now. Will defer that decision to neurology service.

## 2014-05-18 NOTE — Progress Notes (Signed)
CARE MANAGEMENT NOTE 05/18/2014  Patient:  Steven Taylor,Steven Taylor   Account Number:  [id number]  Date Initiated:  05/15/2014  Documentation initiated by:  Carlyle LipaBRYSON,MICHELLE  Subjective/Objective Assessment:   cerebral edema     Action/Plan:   await medical stability to determine d/c needs   Anticipated DC Date:  05/19/2014   Anticipated DC Plan:  HOME/SELF CARE      DC Planning Services  CM consult      Choice offered to / List presented to:             Status of service:  Completed, signed off Medicare Important Message given?  YES (If response is "NO", the following Medicare IM given date fields will be blank) Date Medicare IM given:  05/18/2014 Medicare IM given by:  [facility]han[facility] Date Additional Medicare IM given:   Additional Medicare IM given by:    Discharge Disposition:  HOME/SELF CARE  Per UR Regulation:  Reviewed for med. necessity/level of care/duration of stay  If discussed at Long Length of Stay Meetings, dates discussed:    Comments:  05/18/2014 1100 NCM spoke to pt and no NCM needs identified. Isidoro DonningAlesia Jimie Kuwahara RN CCM Case Mgmt phone [phone]

## 2014-05-18 NOTE — Discharge Summary (Signed)
Physician Discharge Summary   Patient ID: Steven Taylor [id number] 68 y.o. November 12, 1945  Admit date: 05/14/2014  Discharge date and time: 05/18/2014, 1:23 PM  Admitting Physician: Amie Portland, MD   Discharge Physician: Dr. Doy Mince  Admission Diagnoses: Cerebral edema [G93.6] Seizure [R56.9] Vasogenic cerebral edema [G93.6]  Discharge Diagnoses: Vasogenic cerebral edema etiology unknown  Admission Condition: good  Discharged Condition: good  Indication for Admission: Left gaze preference and rhythmic, intermittent twitching of the left face mainly his eyelid without associated impairment of consciousness, with on going HA.   Hospital Course: While hospitalized patient remained stable and had no further seizure activity. Patient remained on Keppra 500 mg BID with no SE, Decadron IV which was switched to po with no SE, BP and vital remained stable.  Patient exam remained non-focal and no significant events occurred while in hospital. It was dicussed with neurosurgery the need for possible brain Bx.  They felt this could be deferred at this time.  They recommended LP and felt there was no danger in doing LP with patients extent of intracranial Edema.  LP was performed and patient tolerated well with no SE or complications.   Consults: neurosurgery  Significant Diagnostic Studies: radiology:  MRI: brain,  05/15/2014 IMPRESSION: 1. Multi focal areas of abnormal T2/FLAIR signal intensity with edema involving the bilateral temporal lobes, left greater than right, with additional involvement of the splenium and bilateral frontal lobes. There are areas of associated irregular postcontrast enhancement within the right temporal lobe. Findings are of uncertain etiology, with primary differential consideration including an acute encephalitis. Possible ADEM could also be considered. Involvement of the splenium raises the possibility of underlying diffusely infiltrative malignancy  (gliomatosis cerebri). Lymphoma is felt to be unlikely given the lack of restricted diffusion.    05/18/2014 IMPRESSION: No significant interval change in abnormal T2 signal involving white greater than gray matter in both cerebral hemispheres, greatest in the left temporal lobe. This is nonspecific, with encephalitis remaining a leading consideration. Other differential considerations include infiltrating neoplasm and demyelinating disease.   Lumbar puncture: Patient was placed in the lateral decub/sitting position. Area was cleaned with betadine and anesthetized with lidocaine. Under sterile conditions 20G LP needle was placed at approximately L3-4 without difficulty. Opening pressure was documented at 12 cm H2O . Approximately 5-6cc of clear CSF fluid was obtained and sent for studies.  EEG: Abnormal EEG due to focal slowing in the left temporal region with intermittent sharp wave activity noted at T5. This may represent a potential seizure foci.    Treatments: IV hydration, steroids: Decadron, AED Keppra and Protonix for GI protection.   Discharge Exam: Mental Status: Alert, oriented x 4, thought content appropriate. Speech mildly dysarthric without evidence of aphasia. Able to follow 3 step commands without difficulty. Cranial Nerves: II: Discs flat bilaterally; Visual fields grossly normal, pupils equal, round, reactive to light and accommodation III,IV, VI: ptosis not present, extra-ocular motions intact bilaterally V,VII: smile asymmetric due to mild left facial droop, facial light touch sensation normal bilaterally VIII: hearing normal bilaterally IX,X: gag reflex present XI: bilateral shoulder shrug XII: midline tongue extension without atrophy or fasciculations  Motor: 5/5 throughout; no atrophy noted Sensory: Pinprick and light touch intact throughout, bilaterally Deep Tendon Reflexes:  2+ throughout Plantars: Right:  downgoingLeft: downgoing Cerebellar: normal finger-to-nose, normal heel-to-shin testing bilaterally   Disposition: Home with out patient neurology follow up.  [facility] will call patient with date and time.   Patient Instructions:    Medication List  ASK your doctor about these medications        cyanocobalamin 500 MCG tablet  Take 500 mcg by mouth daily.     esomeprazole 40 MG capsule  Commonly known as:  NEXIUM  Take 40 mg by mouth daily at 12 noon.     lansoprazole 30 MG capsule  Commonly known as:  PREVACID  Take 30 mg by mouth as needed.     Na Sulfate-K Sulfate-Mg Sulf Soln  Commonly known as:  SUPREP BOWEL PREP  Take 1 kit by mouth once.     rosuvastatin 5 MG tablet  Commonly known as:  CRESTOR  Take 5 mg by mouth as needed.       Activity: activity as tolerated Diet: regular diet Wound Care: none needed  Follow-up with [facility] at discharge.  [facility] will contact patient. His current lab and imaging findings has been discussed in depth.  He has been advised the pending results will be discussed with him on out patient neurology follow up. It has been discussed with patient he is to get his medications filled today at discharge. Nurse Gregary Signs was given printed scripts.  He has been advised No driving, operating heavy machinery, perform activities at heights, swimming or participation in water activities until release by outpatient physician.    Pending labs to be followed by out patient neurology include: Anti-DNA antibody Angiotensin converting antibody ANA HIV IgG both serum and CSF Myelin basic protein Oligoclonal band CSF B. Burgdorfri antibody HSV by CSF CSF cytology   Signed: Martavius Lusty PA-C [facility] [phone]  05/18/2014, 2:11 PM  Patient seen and examined.  Clinical course and management discussed.  Necessary edits performed.  I agree with the above.  Discharge plan of care developed and  discussed above.    Alexis Goodell, MD [facility] [phone]  05/18/2014  2:38 PM

## 2014-05-18 NOTE — Progress Notes (Signed)
ANTIBIOTIC CONSULT NOTE - FOLLOW UP  Pharmacy Consult for acyclovir Indication: r/o HSV encephalitis   Allergies  Allergen Reactions  . Sulfa Antibiotics Rash  . Codeine Nausea Only    Patient Measurements: Height: 6' (182.9 cm) Weight: 171 lb 8 oz (77.792 kg) IBW/kg (Calculated) : 77.6  Vital Signs: Temp: 97.9 F (36.6 C) (12/18 0956) Temp Source: Oral (12/18 0956) BP: 127/63 mmHg (12/18 0956) Pulse Rate: 65 (12/18 0956) Intake/Output from previous day: 12/17 0701 - 12/18 0700 In: 740 [P.O.:740] Out: -  Intake/Output from this shift:    Labs: No results for input(s): WBC, HGB, PLT, LABCREA, CREATININE in the last 72 hours. Estimated Creatinine Clearance: 68.6 mL/min (by C-G formula based on Cr of 1.1). No results for input(s): VANCOTROUGH, VANCOPEAK, VANCORANDOM, GENTTROUGH, GENTPEAK, GENTRANDOM, TOBRATROUGH, TOBRAPEAK, TOBRARND, AMIKACINPEAK, AMIKACINTROU, AMIKACIN in the last 72 hours.   Microbiology: Recent Results (from the past 720 hour(s))  MRSA PCR Screening     Status: None   Collection Time: 05/15/14 12:26 AM  Result Value Ref Range Status   MRSA by PCR NEGATIVE NEGATIVE Final    Comment:        The GeneXpert MRSA Assay (FDA approved for NASAL specimens only), is one component of a comprehensive MRSA colonization surveillance program. It is not intended to diagnose MRSA infection nor to guide or monitor treatment for MRSA infections.   CSF culture     Status: None (Preliminary result)   Collection Time: 05/17/14 11:56 AM  Result Value Ref Range Status   Specimen Description CSF  Final   Special Requests Normal  Final   Gram Stain   Final    NO WBC SEEN NO ORGANISMS SEEN CYTOSPIN Performed at [facility]    Culture NO GROWTH Performed at [facility]   Final   Report Status PENDING  Incomplete    Anti-infectives    Start     Dose/Rate Route Frequency Ordered Stop   05/15/14 1730  acyclovir (ZOVIRAX) 765 mg in dextrose 5 %  150 mL IVPB     10 mg/kg  76.5 kg165.3 mL/hr over 60 Minutes Intravenous 3 times per day 05/15/14 1643        Assessment: 68 y/o male admitted with confusion found to have vasogenic edema. Pharmacy was consulted to begin acyclovir to r/o HSV encephalitis. He continues on day 4 acyclovir. No new labs have been drawn. He is afebrile and WBC are normal.  Goal of Therapy:  Resolution of infection  Plan:  - Continue acyclovir 765 mg IV q8h (10 mg/kg/dose) - Monitor renal function - will check BMET in am  [facility]adel HosptialJennifer Crystal Lawns, RidgelyPharm.D., BCPS Clinical Pharmacist Pager: [phone] 05/18/2014 11:31 AM

## 2014-05-19 LAB — ANGIOTENSIN CONVERTING ENZYME: ANGIOTENSIN-CONVERTING ENZYME: 24 U/L (ref 8–52)

## 2014-05-20 LAB — CSF CULTURE: GRAM STAIN: NONE SEEN

## 2014-05-20 LAB — CSF CULTURE W GRAM STAIN
Culture: NO GROWTH
Special Requests: NORMAL

## 2014-05-21 LAB — ANTI-DNA ANTIBODY, DOUBLE-STRANDED

## 2014-05-22 ENCOUNTER — Telehealth: Payer: Self-pay | Admitting: Neurology

## 2014-05-22 LAB — MYELIN BASIC PROTEIN, CSF: Myelin Basic Protein: <2 mcg/L (ref 0.0–4.0)

## 2014-05-22 NOTE — Telephone Encounter (Signed)
I called and scheduled appt with pt for 05/29/14 He said he needed to talk with Dr Anne HahnWillis  about the meds he was given and wanted to know results of the test he had in the hospital. Please call. dg

## 2014-05-22 NOTE — Telephone Encounter (Signed)
Spoke to patient and relayed that since he hasn't been seen in our office yet, the doctor will go over his tests and medicines next week at his new patient appointment.  Also the doctor is out of office.  He was wanting something to help him sleep.  I relayed that he needed to contact his PCP to help him with that issue.

## 2014-05-29 ENCOUNTER — Encounter: Payer: Self-pay | Admitting: Neurology

## 2014-05-29 ENCOUNTER — Ambulatory Visit (INDEPENDENT_AMBULATORY_CARE_PROVIDER_SITE_OTHER): Payer: Medicare Other | Admitting: Neurology

## 2014-05-29 VITALS — BP 168/82 | HR 60 | Ht 72.0 in | Wt 158.6 lb

## 2014-05-29 DIAGNOSIS — R569 Unspecified convulsions: Secondary | ICD-10-CM

## 2014-05-29 DIAGNOSIS — G936 Cerebral edema: Secondary | ICD-10-CM

## 2014-05-29 NOTE — Progress Notes (Signed)
Reason for visit: Seizures  Steven Taylor is a 68 y.o. male  History of present illness:  Steven Taylor is a 68 year old right-handed white male with a recent hospitalization on 05/15/2014. The patient indicates that he was feeling well the day of admission. He had a meeting at church, and he developed a mild right frontal headache just prior to the meeting. Following the meeting, the headache was more severe, and he tried to drive home, but he was pulled over by a police officer, as he was swerving his car. The patient told the officer that he was not feeling well, and the police followed him for another mile down the road, but the patient was acting erratically, and he was pulled over again. At this point, he was more confused, and the wife was called to pick him up. He was taken to the hospital, but before he arrived at the hospital, he began having left facial twitching and head turning to the right, and eye deviation. The patient was found to have very abnormal MRI of the brain with vasogenic edema affecting the right hemisphere primarily, but bilateral edema was noted. The patient had a blood pressure of 177/93 on admission. He normally does not have issues with blood pressure. The patient was placed on Keppra which controlled the seizures. The patient reports some periods of amnesia the day of admission, and problems with visual distortion that improved within 24 hours following admission. A spinal tap was done, but the results were completely unremarkable. ANA was negative. A repeat MRI of the brain done on December 18 showed no significant change. The patient reports some issues with fatigue for several years, slightly worse over the last several months. He denies any history of headaches. He reports no numbness or weakness on the face, arms, or legs. He denies any issues controlling the bowels or the bladder. He was placed on Decadron taking 12 mg daily following the hospitalization. He is sent to  this office for an evaluation. No further seizures have occurred. The patient feels at or near baseline. He has a mild pressure sensation on the right frontotemporal area, and some change in taste sensation.  Past Medical History  Diagnosis Date  . GERD (gastroesophageal reflux disease)   . Hyperlipidemia   . CHI (closed head injury)     2004    Past Surgical History  Procedure Laterality Date  . Parosmia    . Disc surgurgy      1991    Family History  Problem Relation Age of Onset  . Ovarian cancer Mother   . Pneumonia Mother   . Heart attack Father   . Healthy Brother     Social history:  reports that he has never smoked. He has never used smokeless tobacco. He reports that he does not drink alcohol or use illicit drugs.  Medications:  Current Outpatient Prescriptions on File Prior to Visit  Medication Sig Dispense Refill  . dexamethasone (DECADRON) 4 MG tablet Take 1 tablet (4 mg total) by mouth every 8 (eight) hours. (Patient taking differently: Take 4 mg by mouth 2 (two) times daily. ) 93 tablet 0  . esomeprazole (NEXIUM) 40 MG capsule Take 40 mg by mouth daily at 12 noon.    . levETIRAcetam (KEPPRA) 500 MG tablet Take 1 tablet (500 mg total) by mouth 2 (two) times daily. 60 tablet 1  . pantoprazole (PROTONIX) 20 MG tablet Take 1 tablet (20 mg total) by mouth daily. 30 tablet  1  . cyanocobalamin 500 MCG tablet Take 500 mcg by mouth daily.     No current facility-administered medications on file prior to visit.      Allergies  Allergen Reactions  . Sulfa Antibiotics Rash  . Codeine Nausea Only    ROS:  Out of a complete 14 system review of symptoms, the patient complains only of the following symptoms, and all other reviewed systems are negative.  Fatigue Ringing in the ears Blurred vision Constipation Achy muscles Confusion, weakness, seizure Insomnia, decreased energy, change in appetite Sleepiness  Blood pressure 168/82, pulse 60, height 6' (1.829 m),  weight 158 lb 9.6 oz (71.94 kg).  Physical Exam  General: The patient is alert and cooperative at the time of the examination.  Eyes: Pupils are equal, round, and reactive to light. Discs are flat bilaterally.  Neck: The neck is supple, no carotid bruits are noted.  Respiratory: The respiratory examination is clear.  Cardiovascular: The cardiovascular examination reveals a regular rate and rhythm, no obvious murmurs or rubs are noted.  Skin: Extremities are without significant edema.  Neurologic Exam  Mental status: The patient is alert and oriented x 3 at the time of the examination. The patient has apparent normal recent and remote memory, with an apparently normal attention span and concentration ability.  Cranial nerves: Facial symmetry is present. There is good sensation of the face to pinprick and soft touch bilaterally. The strength of the facial muscles and the muscles to head turning and shoulder shrug are normal bilaterally. Speech is well enunciated, no aphasia or dysarthria is noted. Extraocular movements are full. Visual fields are full. The tongue is midline, and the patient has symmetric elevation of the soft palate. No obvious hearing deficits are noted.  Motor: The motor testing reveals 5 over 5 strength of all 4 extremities. Good symmetric motor tone is noted throughout.  Sensory: Sensory testing is intact to pinprick, soft touch, vibration sensation, and position sense on all 4 extremities. No evidence of extinction is noted.  Coordination: Cerebellar testing reveals good finger-nose-finger and heel-to-shin bilaterally.  Gait and station: Gait is normal. Tandem gait is normal. Romberg is negative. No drift is seen.  Reflexes: Deep tendon reflexes are symmetric and normal bilaterally, with exception that there are 2 or 3 beats of ankle clonus on the left. Toes are downgoing bilaterally.   Assessment/Plan:  1. Seizures, new onset  2. History of closed head  injury  3. Abnormal MRI brain, vasogenic edema, right greater than left hemisphere  The patient has a very abnormal MRI of the brain, but clinical examination is unremarkable. The patient appears to have vasogenic edema for some reason. The pattern does not completely appear to be consistent with the PRES syndrome. The Decadron dosing will be reduced taking 8 mg daily, and blood work will be done today. We will repeat the MRI of the brain within the next 2-3 weeks. The patient will follow-up in 2 months, sooner if needed. If the MRI brain abnormalities worsens over time, a brain biopsy may need to be done in the future.  Marlan Palau. Keith Willis MD 05/29/2014 10:04 PM  [facility] [address] [place], [place] [id number]  Phone [phone] Fax [phone]

## 2014-05-29 NOTE — Patient Instructions (Signed)

## 2014-05-30 LAB — LYME, TOTAL AB TEST/REFLEX: Lyme IgG/IgM Ab: <0.91 {ISR} (ref 0.00–0.90)

## 2014-05-30 LAB — ANTINEUTROPHIL CYTOPLASMIC AB
Atypical pANCA: <1:20 {titer}
C-ANCA: <1:20 {titer}
P-ANCA: <1:20 {titer}

## 2014-05-30 LAB — RPR: SYPHILIS RPR SCR: NONREACTIVE

## 2014-05-30 LAB — ANTIMYELOPEROXIDASE (MPO) ABS: Myeloperoxidase Ab: <9 U/mL (ref 0.0–9.0)

## 2014-06-05 ENCOUNTER — Encounter: Payer: Self-pay | Admitting: Neurology

## 2014-06-12 ENCOUNTER — Telehealth: Payer: Self-pay | Admitting: Neurology

## 2014-06-12 DIAGNOSIS — R9089 Other abnormal findings on diagnostic imaging of central nervous system: Secondary | ICD-10-CM

## 2014-06-12 NOTE — Telephone Encounter (Signed)
I called patient. We will reschedule the MRI the brain to follow-up for the significant abnormalities seen previously. The patient remains on Decadron.

## 2014-06-19 ENCOUNTER — Other Ambulatory Visit: Payer: Self-pay | Admitting: Neurology

## 2014-06-19 ENCOUNTER — Ambulatory Visit
Admission: RE | Admit: 2014-06-19 | Discharge: 2014-06-19 | Disposition: A | Discharge status: Home / Self Care | Payer: PRIVATE HEALTH INSURANCE | Source: Ambulatory Visit | Attending: Neurology | Admitting: Neurology

## 2014-06-19 DIAGNOSIS — R930 Abnormal findings on diagnostic imaging of skull and head, not elsewhere classified: Secondary | ICD-10-CM

## 2014-06-19 DIAGNOSIS — Z77018 Contact with and (suspected) exposure to other hazardous metals: Secondary | ICD-10-CM

## 2014-06-19 DIAGNOSIS — R9089 Other abnormal findings on diagnostic imaging of central nervous system: Secondary | ICD-10-CM

## 2014-06-21 ENCOUNTER — Telehealth: Payer: Self-pay | Admitting: Neurology

## 2014-06-21 DIAGNOSIS — R9089 Other abnormal findings on diagnostic imaging of central nervous system: Secondary | ICD-10-CM

## 2014-06-21 NOTE — Telephone Encounter (Signed)
I called patient, left message, I will call him back later. MRI shows evolutionary changes on the MRI with some enlargement of the lesion in the right temporal lobe, the patient likely will need a brain biopsy.   MRI brain 06/21/14:  IMPRESSION:  Abnormal MRI brain (without) demonstrating: 1. 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 This area showed some contrast enhancement on the 05/15/14 study. Now it appears to be larger than in prior studies from 05/15/14 and 05/18/14.  2. Stable bifrontal, left temporal and splenium abnormalities. The left temporal lobe confluent white matter abnormalities are extensive, with some mas effect upon the left temporal horn. No midline shift.  3. Review of imaging report of CT head from 04/12/01 mentions prior traumatic bifrontal and right temporal hemorrhages, but images are not available to review. Therefore, some of the abnormalities noted in recent imaging could be related to those prior traumatic findings. However the extensive left temporal, splenium and enhancing right temporal lesions appear to be new findings related to a new pathologic process. Considerations for the newer abnormalities include infiltrative neoplastic processes, autoimmune/inflammatory conditions (such as ADEM), or encephalitic processes (infectious or paraneoplastic).

## 2014-06-21 NOTE — Telephone Encounter (Signed)
I called patient. I discussed the results of the MRI study. I have recommended a neurosurgery referral, he is amenable to this. I will get this set up.  The patient indicates that he is doing well, he has a mild headache at night, mild gait instability, no further seizures.

## 2014-06-22 ENCOUNTER — Other Ambulatory Visit: Payer: BC Managed Care – PPO

## 2014-06-29 ENCOUNTER — Encounter: Payer: Self-pay | Admitting: Neurology

## 2014-06-29 LAB — AFB CULTURE WITH SMEAR (NOT AT ARMC)
ACID FAST SMEAR: NONE SEEN
SPECIAL REQUESTS: NORMAL

## 2014-07-03 ENCOUNTER — Other Ambulatory Visit: Payer: Self-pay | Admitting: Neurology

## 2014-07-03 DIAGNOSIS — R9089 Other abnormal findings on diagnostic imaging of central nervous system: Secondary | ICD-10-CM

## 2014-07-16 ENCOUNTER — Encounter: Payer: Self-pay | Admitting: Neurology

## 2014-07-17 MED ORDER — DEXAMETHASONE 4 MG PO TABS: 4.0000 mg | ORAL_TABLET | Freq: Every day | ORAL | Status: AC

## 2014-07-17 MED ORDER — LEVETIRACETAM 500 MG PO TABS: 500.0000 mg | ORAL_TABLET | Freq: Two times a day (BID) | ORAL | Status: AC

## 2014-07-29 ENCOUNTER — Encounter: Payer: Self-pay | Admitting: Neurology

## 2014-07-30 ENCOUNTER — Ambulatory Visit: Payer: Medicare Other | Admitting: Neurology

## 2014-11-06 ENCOUNTER — Other Ambulatory Visit: Payer: Self-pay | Admitting: Internal Medicine

## 2014-11-12 ENCOUNTER — Encounter: Admission: RE | Payer: Self-pay | Source: Ambulatory Visit

## 2014-11-12 ENCOUNTER — Ambulatory Visit: Admission: RE | Admit: 2014-11-12 | Payer: Medicare Other | Source: Ambulatory Visit | Admitting: Vascular Surgery

## 2014-11-12 SURGERY — PICC LINE INSERTION
Anesthesia: Moderate Sedation

## 2014-11-12 MED ORDER — CEFAZOLIN SODIUM 1-5 GM-% IV SOLN: 1.0000 g | Freq: Once | INTRAVENOUS | Status: DC

## 2014-11-26 ENCOUNTER — Other Ambulatory Visit: Payer: Self-pay

## 2014-11-30 DEATH — deceased

## 2016-12-26 IMAGING — CR DG ORBITS FOR FOREIGN BODY
2 series · 2 of 2 positions shown · non-contrast
Comparison: None.

CLINICAL DATA: Metal working/exposure; clearance prior to MRI

EXAM:
ORBITS FOR FOREIGN BODY - 2 VIEW

[view not recorded (1 of 2)]
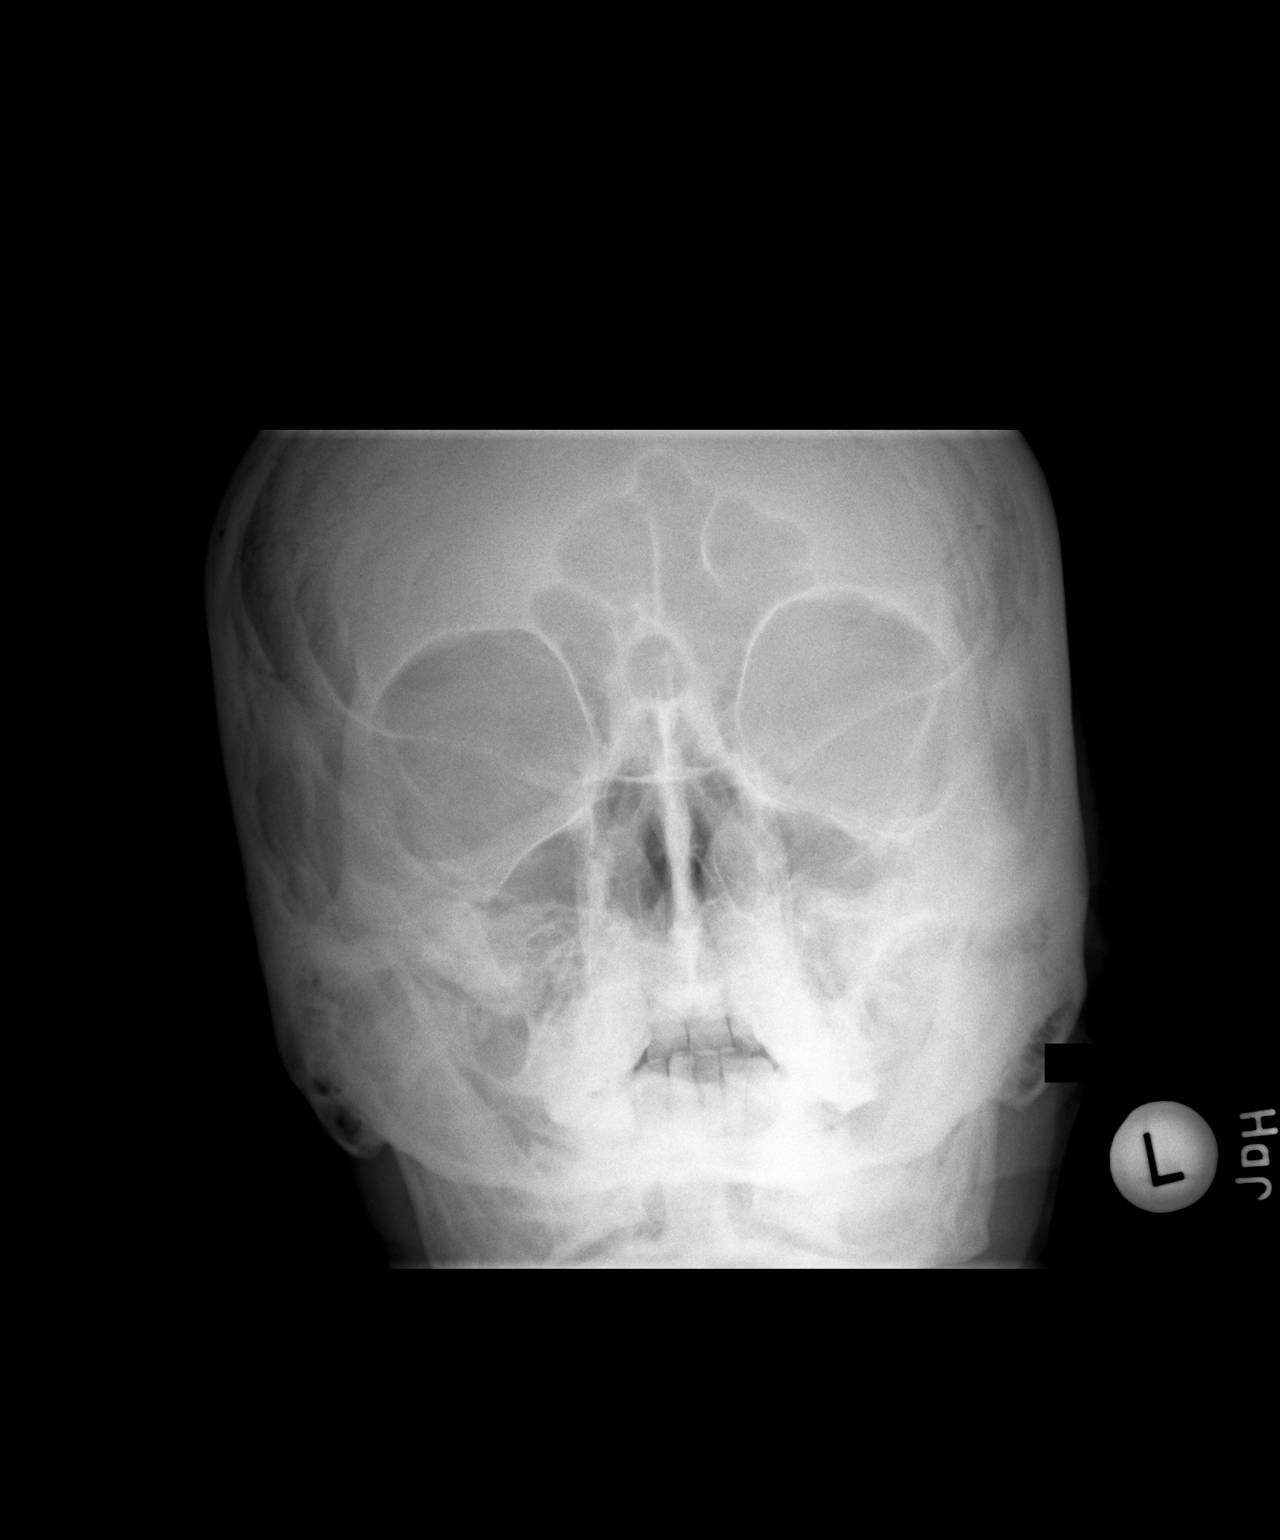

[view not recorded (2 of 2)]
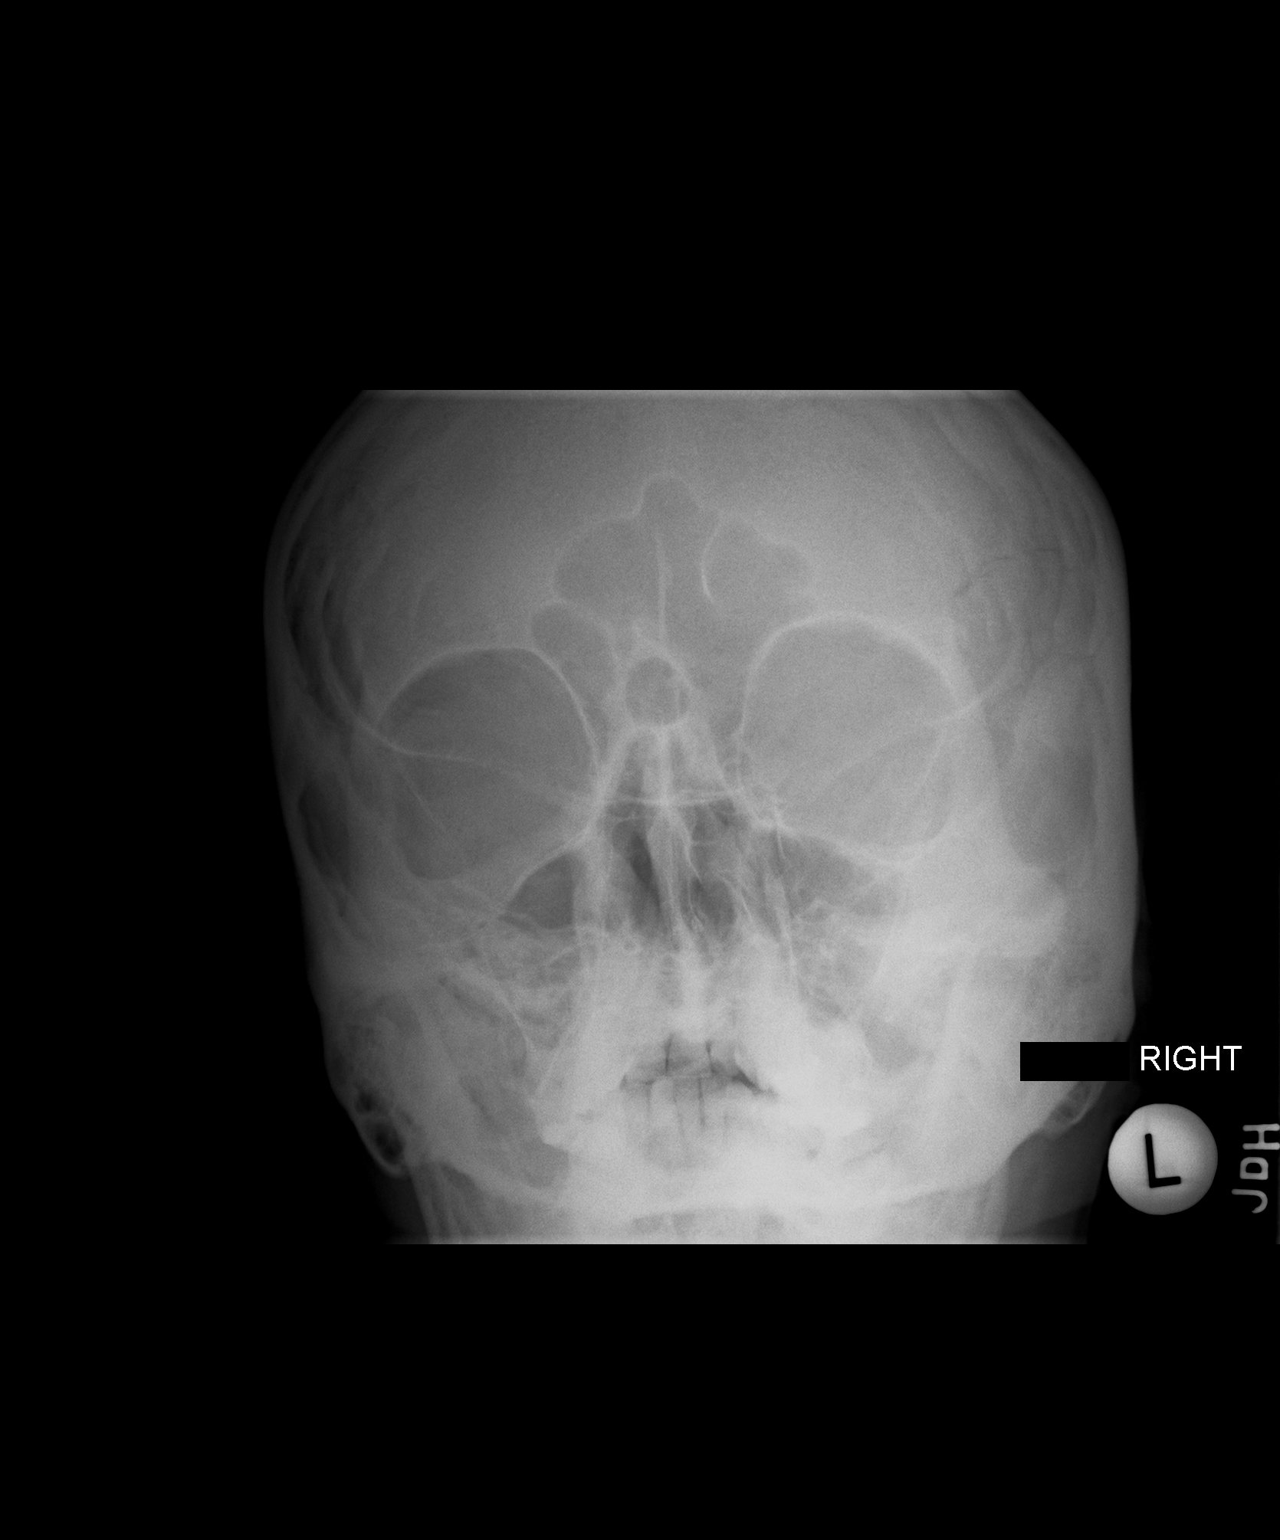

[2 of 2 positions shown; findings below may reference images not displayed]

FINDINGS: There is no evidence of metallic foreign body within the orbits. No
significant bone abnormality identified.
IMPRESSION: No evidence of metallic foreign body within the orbits.
